# Patient Record
Sex: Female | Born: 1955 | Race: White | Hispanic: No | Marital: Married
Health system: Midwestern US, Academic
[De-identification: ages and names within clinical notes are randomized; demographics above are authoritative.]

---

## 2021-01-15 NOTE — Unmapped (Signed)
Encounter created in Care Everywhere.

## 2021-01-29 ENCOUNTER — Ambulatory Visit: Admit: 2021-01-29 | Payer: PRIVATE HEALTH INSURANCE

## 2021-01-29 ENCOUNTER — Ambulatory Visit: Admit: 2021-01-29 | Payer: PRIVATE HEALTH INSURANCE | Attending: Hematology

## 2021-01-29 DIAGNOSIS — C9110 Chronic lymphocytic leukemia of B-cell type not having achieved remission: Secondary | ICD-10-CM

## 2021-01-29 LAB — MAGNESIUM: Magnesium: 2.2 mg/dL (ref 1.5–2.5)

## 2021-01-29 LAB — DIFFERENTIAL
Basophils Absolute: 0 /uL (ref 0–200)
Basophils Relative: 0 % (ref 0.0–1.0)
Eosinophils Absolute: 436 /uL (ref 15–500)
Eosinophils Relative: 1 % (ref 0.0–8.0)
Lymphocytes Absolute: 36624 /uL (ref 850–3900)
Lymphocytes Relative: 84 % (ref 15.0–45.0)
Monocytes Absolute: 436 /uL (ref 200–950)
Monocytes Relative: 1 % (ref 0.0–12.0)
Neutrophils Absolute: 2616 /uL (ref 1500–7800)
Neutrophils Relative: 6 % (ref 40.0–80.0)
Other Cells Relative: 8 % (ref 0.0–0.0)
PLT Morphology: NORMAL

## 2021-01-29 LAB — COMPREHENSIVE METABOLIC PANEL
ALT: 13 U/L (ref 7–52)
AST: 21 U/L (ref 13–39)
Albumin: 3.7 g/dL (ref 3.5–5.7)
Alkaline Phosphatase: 68 U/L (ref 36–125)
Anion Gap: 8 mmol/L (ref 3–16)
BUN: 17 mg/dL (ref 7–25)
CO2: 29 mmol/L (ref 21–33)
Calcium: 8.6 mg/dL (ref 8.6–10.3)
Chloride: 105 mmol/L (ref 98–110)
Creatinine: 0.82 mg/dL (ref 0.60–1.30)
EGFR: 78
Glucose: 82 mg/dL (ref 70–100)
Osmolality, Calculated: 295 mosm/kg (ref 278–305)
Potassium: 4 mmol/L (ref 3.5–5.3)
Sodium: 142 mmol/L (ref 133–146)
Total Bilirubin: 0.6 mg/dL (ref 0.0–1.5)
Total Protein: 5.6 g/dL — ABNORMAL LOW (ref 6.4–8.9)

## 2021-01-29 LAB — CBC
Hematocrit: 33.4 % (ref 35.0–45.0)
Hemoglobin: 10.8 g/dL (ref 11.7–15.5)
MCH: 30.6 pg (ref 27.0–33.0)
MCHC: 32.5 g/dL (ref 32.0–36.0)
MCV: 94.1 fL (ref 80.0–100.0)
MPV: 7.4 fL (ref 7.5–11.5)
Platelets: 255 10*3/uL (ref 140–400)
RBC: 3.54 10*6/uL (ref 3.80–5.10)
RDW: 21.5 % (ref 11.0–15.0)
WBC: 43.6 10*3/uL (ref 3.8–10.8)

## 2021-01-29 LAB — CLL FISH PANEL

## 2021-01-29 LAB — LACTATE DEHYDROGENASE: LD: 647 U/L (ref 110–270)

## 2021-01-29 LAB — CYTOMEGALOVIRUS DNA, QUANT, RT PCR: CMV DNA Qnt: NOT DETECTED IU/mL

## 2021-01-29 LAB — RETICULOCYTE COUNT, AUTO
Immature Retic Fract: 0.59 (ref 0.09–0.56)
Retic Ct Abs: 79118 /uL (ref 20000–80000)
Retic Ct Pct: 2.21 % (ref 0.50–2.00)

## 2021-01-29 LAB — URIC ACID: Uric Acid: 5.5 mg/dL (ref 3.8–8.7)

## 2021-01-29 LAB — EPSTEIN BARR VIRUS DNA, QNT PCR, BLOOD: EBV DNA, Quantitative PCR: NOT DETECTED IU/mL

## 2021-01-29 LAB — OSU MISCELLANEOUS REFERENCE TEST

## 2021-01-29 LAB — PHOSPHORUS: Phosphorus: 3.9 mg/dL (ref 2.1–4.5)

## 2021-01-29 LAB — DIRECT ANTIGLOBULIN TEST: DAT: NEGATIVE

## 2021-01-29 LAB — HAPTOGLOBIN: Haptoglobin: 130 mg/dL (ref 44–215)

## 2021-01-29 NOTE — Unmapped (Addendum)
-   PET scan at [facility]  - Continue to practice Covid-19 precautions and call us immediately if you have any Covid-19 exposures    Contact the Hematology/ Oncology Office at [phone]  (Option 3 to speak with a nurse)  If it is after hours Between 5 PM and 8 AM (overnight), weekends and holidays:  You will be forwarded to the [facility] Operator.  Ask for the Hematology/Oncology Dr. on call to address your issues.     When to contact your doctor or health care provider:  Contact your health care provider immediately, day or night, if you should experience any of the following symptoms, or go to the ER if after normal clinic hours (8am-5pm):  Fever of 100.4?? F (38?? C) or higher, chills (possible signs of infection)  Sudden weakness of arms or legs, or difficulty speaking  Sudden onset of shortness of breath or chest pain   New onset of seizures  Severe allergic reaction with swelling of mouth or throat, or trouble breathing  Sudden changes in alertness or loss of consciousness  The following symptoms require medical attention, but are not an emergency.  Contact your health care provider within 24 hours of noticing any of the following:  Nausea (interferes with ability to eat and unrelieved with prescribed medication).   Vomiting (vomiting more than 2-3 times in a 24 hour period, not relieved by nausea medication).   Diarrhea (more than 3 episodes in a 24-hour period, not relieved by anti-diarrheal).   Unusual bleeding or bruising   Black or tarry stools, or blood in your stools.   Blood in the urine.   Extreme fatigue (unable to carry on self-care activities).   Constipation unrelieved by laxative use.   Swelling of the feet or ankles.  Sudden weight gain.   Signs of infection such as redness or swelling, pain on swallowing, coughing up mucous, or painful urination.   Unable to eat or drink for 24 hours or have signs of dehydration: tiredness, thirst, dry mouth, dark and decrease amount of urine, or  dizziness.      Contact Numbers:    Clinic Phone & Appointment Changes: [phone] (option 1 for scheduling, option 3 to speak with a nurse)  Clinic Fax: [phone]    For Medication Refills:     Please allow atleast 2 business days for all medication refills, including narcotics. Please reach out to the filling pharmacy to request a refill for all non-narcotic medications. The filling pharmacy should also be who to contact to verify when to pick up the medication.     All Paperwork:    Please allow up to 7 business days from the time of request for all disability, FMLA, etc to be filled out. Please specify what your request is as to what and where we should send completed paperwork. (This is to inform us if we should send the completed papers to you or directly to your employer).

## 2021-01-29 NOTE — Unmapped (Signed)
I participated in obtaining the history, past medical, past surgical, family history, social history, review of systems, physical exam, lab review, and development of diagnosis, treatment plan, and follow-up for the patient in conjunction with the fellow rotating with [place] in clinic.  The plan outlined in the note by the fellow is one that I have reviewed and developed together with them. Karen Lamb has findings concerning for progression on CC99282 + Obinutuzumab with increasing nodes (cervical and axillary region), lymphocytosis, and right inguinal node which she has noted disproportionate nodal enlargement over the past 2-3 weeks which is hard to palpation as well.  She has noted intermittent pre-night sweats and warmness. Fatigue has also increased.  Review of recent labs and those at [facility] show rising lymphocyte count and LDH 2+ ULN.  She has not had genomic studies since going on CC99282.  We will send off a [facility] panel and PLCG2/BTK panel ([facility]). Assessment of FISH also sent.  She is going to [facility] and needs a PET scan to determine SUV of inguinal area to see if high SUV present.  If this is present, I would have concern for either Richter's syndrome or Hodgkin's disease and directed biopsy would be needed.  Her disease has had some proliferative component in the past so I would hope this is not Richter's/  EBV infection could also cause this presentation and we will rule this out with EBV PCR.  Relative to therapy, for Richter's we would favor cytoreduction (non chemotherapy) followed by CAR-T cell would be the direction I would go.  If Hodgkin's we would treat with curative ABVD.  Finally, if CLL, determining the current genomic status of her tumor, in particular if PLCG2 mutation has disappeared would consider a 3rd or 4th generation BTK inhibitor (LP-168).  If PLCG2 mutation is present, I would agree with the PKC-B targeting agent together with venetoclax.   Standard off therapy treatment I would  consider venetoclax +/- rituximab.  For BTKi resistant disease, the benefit of adding CD20 antibody to venetoclax is uncertain.  I would not do the BTK degrader study as they have reported a 20% frequency of atrial fibrillation thus far and also will be going to an intermittent schedule which could result in flare of disease.  It is unclear how the FDA will also view the BTK degrader and it will not be surprising during the time of evaluation of her that they could even be put on clinical hold.  I talked with Ms. Samuel Bouche at [facility] about our evaluation.  We are happy to remain involved in her care.

## 2021-01-29 NOTE — Unmapped (Signed)
[facility]  Outpatient Hematology Clinic Note    Patient Name: Karen Lamb  MRN: [id number]    Follow up Visit For: Follow up, night sweats, enlarging Rt inguinal lymph nodes over the last 6-7 weeks    History of Present Illness      Karen Lamb is a 65 y.o. 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  She has a relapsed/refractory disease s/p multiple lines of therapy. Most recently on clinical trial [facility]71 (obinutuzumab & [id number]). Clinical trial drug dose reduced due to significant side effects.  Patient is well known to Dr. Colette Ribas. She used to follow with him at [facility].    Today, she presents to discuss next treatment options and potential clinical trials.  Karen Lamb reported today new onset night sweats, frequent over the last 2 weeks, bed and cloths get soaked with sweats. Also reported recent low grade fever, that responded to tylenol. Did not take any antibiotics, and denied any other symptoms to suggest infectious etiology (cough, SOB, dysuria, skin lesions).  In Jul/2022, she was noted to have Rt inguinal lymph node, was tender at the time, since then, it has more than doubled in size, became non tender, and visible. She does not feel any other lymph nodes herself.  CT scan of the chest on Jun/29/22 showed worsening axillary lymph adenopathy bilaterally. Lymphocytosis worsening over the last 2 months. apparently her disease became unresponsive to the current treatment regimen.  She is scheduled to see Dr. Tamala Julian and Samuel Bouche, NP at [facility] tomorrow also to discuss clinical trials options.      Oncologic History  Dx: CLL (IgVH unmutated, t(14;19),  Prognostic Score:  ECOG Performance Score: 0  Cytogenetics/Molecular:  BTK C481S and multiple PLCG2 mutations are detected again in this B-cell sorted specimen, along with the NOTCH1 PEST domain mutation and SETD2 and SH2B3 variants, consistent with persistent  CLL.  Treatment History:  2011 - Flavopiridol, Cytoxan, Rituximab in 2011  2014-2017  acalabrutinib, developed drug resistance  01/01/16 -  started ibrutinib on 01/01/2016, added VAY736 in 2018, and then progressed.   12/2017 -04/2019 duvelisib (ibrutinib ended on 12/14/17)  2021 - trial with LOXO-305 (progressed) and ARQ-531 (progressed)     12/07/19-01/29/21, she is on [facility] 20071 (obinutuzumab & [id number]). She started obinutuzumab 12/07/19 (given in hospital for TLS risk) . Then started [id number] 0.2 mg daily x 7 days with 7 days off - first week ended 12/27/19.Treatment has been complicated by neutropenia treated with intermittent G-CSF. [id number] dose was reduced 50% in cycle 3.  She initially had a good response to obinutuzuamb+CC99282 however has had increasing lymphocytosis since February 2022. Increased adenopathy noted on CTs in June 2022. (disease progression, being evaluated for Richter's)      History     Past Medical History  Past Medical History:   Diagnosis Date   ??? History of blood transfusion      Past Surgical History  History reviewed. No pertinent surgical history.  Family History  History reviewed. No pertinent family history.  Social History  Social History     Tobacco Use   ??? Smoking status: Never Smoker   ??? Smokeless tobacco: Never Used   Substance Use Topics   ??? Alcohol use: Yes     Comment: 3 or 4 week   ??? Drug use: Never     Allergies  No Known Allergies  Medications  Current Outpatient Medications   Medication  Sig Dispense Refill   ??? acetaminophen (TYLENOL) 325 MG tablet Take 650 mg by mouth as needed.     ??? calcium-magnesium-zinc 333-133-5 mg Tab Take 1 tablet by mouth 3 times daily as needed.     ??? cholecalciferol, vitamin D3, 50 mcg (2,000 unit) Cap Take 2,000 Units by mouth daily.     ??? conjugated estrogens (PREMARIN) vaginal cream 0.5 g by Vaginal route every Wednesday.     ??? fluticasone propionate (FLONASE) 50 mcg/actuation nasal spray 2 sprays by Nasal route daily. QAM     ??? latanoprost  (XALATAN) 0.005 % ophthalmic solution 1 drop into affected eye in the evening     ??? lifitegrast 5 % Dpet Apply to eye 2 times daily.     ??? PREVIDENT 5000 BOOSTER PLUS 1.1 % Pste      ??? tretinoin (RETIN-A) 0.025 % cream APPLY a pea size amount to entire face EVERY DAY AT BEDTIME as able     ??? valACYclovir (VALTREX) 1000 MG tablet Take 1 tablet by mouth daily.       No current facility-administered medications for this visit.       ROS, Vitals and Exam      ROS:   Review of Systems   Constitutional: Positive for chills, fever, malaise/fatigue and weight loss.        Night sweats   HENT: Negative for congestion, ear discharge, hearing loss and sore throat.    Eyes: Negative for double vision, pain and redness.   Respiratory: Negative for cough, sputum production, shortness of breath and wheezing.    Cardiovascular: Negative for chest pain, palpitations, orthopnea and leg swelling.   Gastrointestinal: Negative for abdominal pain, constipation, diarrhea, heartburn, nausea and vomiting.   Genitourinary: Negative for dysuria and hematuria.   Musculoskeletal: Negative for joint pain and myalgias.   Skin: Negative for itching and rash.   Neurological: Negative for dizziness, tingling, speech change, focal weakness and weakness.   Endo/Heme/Allergies: Does not bruise/bleed easily.        Palpable Rt inguinal mass   Psychiatric/Behavioral: Negative for depression. The patient is not nervous/anxious.          Vitals:    01/29/21 1401   BP: 118/67   Pulse: 83   Resp: 16   Temp: 97.5 ??F (36.4 ??C)   SpO2: 95%       Physical Examination  Constitutional: She is oriented to person, place, and time. She appears well-developed and well-nourished. No distress.   HENT: Head: Normocephalic and atraumatic.   Eyes: EOM are normal.   Neck: Normal range of motion. Neck supple.   Cardiovascular: Normal rate, regular rhythm and normal heart sounds.    Pulmonary/Chest: Effort normal and breath sounds normal. No respiratory distress. No wheezes.  She has no rales. She exhibits no tenderness.   Abdominal: Soft. She exhibits no distension. There is no tenderness. Spleen tip palpable, 4 cm below costal margin   Musculoskeletal: Normal range of motion. She exhibits no edema and no tenderness.   Neurological: She is alert and oriented to person, place, and time. She exhibits normal muscle tone. Coordination normal.   Skin: Skin is warm and dry. No rash noted. She is not diaphoretic. No erythema.   Psychiatric: She has a normal mood and affect. Her behavior is normal. Judgment and thought content normal.  Lymph Node examination:   Location Right cm Left cm   Cervical 2x1 2x2   Supraclavicular 1x1 1x1   Axillary 1x1 2x2  Inguinal 3x3 firm texture 1x1   Femoral neg neg        Recent imaging, pathology and labs     Laboratories  Appointment on 01/29/2021   Component Date Value   ??? DAT 01/29/2021 Negative    ??? Retic Ct Pct 01/29/2021 2.21 (A)   ??? Retic Ct Abs 01/29/2021 79,118    ??? Immature Retic Fract 01/29/2021 0.59 (A)   ??? Haptoglobin 01/29/2021 130    ??? Magnesium 01/29/2021 2.2    ??? Phosphorus 01/29/2021 3.9    Appointment on 01/29/2021   Component Date Value   ??? WBC 01/29/2021 43.6 (A)   ??? RBC 01/29/2021 3.54 (A)   ??? Hemoglobin 01/29/2021 10.8 (A)   ??? Hematocrit 01/29/2021 33.4 (A)   ??? MCV 01/29/2021 94.1    ??? Karen Lamb 01/29/2021 30.6    ??? MCHC 01/29/2021 32.5    ??? RDW 01/29/2021 21.5 (A)   ??? Platelets 01/29/2021 255    ??? MPV 01/29/2021 7.4 (A)   ??? Neutrophils Relative 01/29/2021 6.0 (A)   ??? Lymphocytes Relative 01/29/2021 84.0 (A)   ??? Monocytes Relative 01/29/2021 1.0    ??? Eosinophils Relative 01/29/2021 1.0    ??? Basophils Relative 01/29/2021 0.0    ??? Other Cells Relative 01/29/2021 8.0 (A)   ??? Neutrophils Absolute 01/29/2021 2,616    ??? Lymphocytes Absolute 01/29/2021 36,624 (A)   ??? Monocytes Absolute 01/29/2021 436    ??? Eosinophils Absolute 01/29/2021 436    ??? Basophils Absolute 01/29/2021 0    ??? PLT Morphology 01/29/2021 Platelet morphology appears normal    ???  Sodium 01/29/2021 142    ??? Potassium 01/29/2021 4.0    ??? Chloride 01/29/2021 105    ??? CO2 01/29/2021 29    ??? Anion Gap 01/29/2021 8    ??? BUN 01/29/2021 17    ??? Creatinine 01/29/2021 0.82    ??? Glucose 01/29/2021 82    ??? Calcium 01/29/2021 8.6    ??? Total Bilirubin 01/29/2021 0.6    ??? AST 01/29/2021 21    ??? ALT 01/29/2021 13    ??? Alkaline Phosphatase 01/29/2021 68    ??? Total Protein 01/29/2021 5.6 (A)   ??? Albumin 01/29/2021 3.7    ??? Osmolality, Calculated 01/29/2021 295    ??? EGFR 01/29/2021 78    ??? LD 01/29/2021 647 (A)   ??? Uric Acid 01/29/2021 5.5         01/03/21 12/05/20 11/07/20 10/11/20 09/12/20 08/16/20   LDH Total 379 412??High?? 306??High?? 299??High?? 311??High?? 306??High??   LACTATE DEHYDROGENASE -- -- -- -- -- --      Ref Range & Units 3 wk ago   IGG 600 - 1560 mg/dL 161??Low??    IgA 90 - 410 mg/dL 20??Low??    IGM 30 - 360 mg/dL 49          NGS 0/96/0454  SEQUENCE ALTERATIONS DETECTED   _________________________________________________________________________   Gene: ??BTK   Alteration: ??p.C481S   Exon: ??14   Nucleotide: ??c.1441T>A   Type: ??Substitution - Missense   VAF: ??0.9%   Class: ??Pathogenic   Genomic Source Class: ??Somatic   COSMIC: ??[id number], [id number]   dbSNP (ExAC MAF, Count): ??[id number] (0.0, 0)   Notes: ?? Tier 1, therapy resistance (CLL)       Gene: ??NOTCH1   Alteration: ??p.P2543fs   Exon: ??34   Nucleotide: ??L.2440_1027OZDGU   Type: ??Deletion - Frameshift   VAF: ??81.6%   Class: ??Pathogenic   Genomic Source Class: ??Somatic  COSMIC: ??[id number], [id number]   dbSNP (ExAC MAF, Count): ??[id number] (0.00, U923051)   Notes: ?? Tier 1 (diagnosis, CLL)       Gene: ??PLCG2   Alteration: ??p.S707F   Exon: ??19   Nucleotide: ??c.2120C>T   Type: ??Substitution - Missense   VAF: ??26.3%   Class: ??Pathogenic   Genomic Source Class: ??Somatic   COSMIC: ??[id number], [id number]   dbSNP (ExAC MAF, Count): ??NA (0.0, 0)   Notes: ??Tier 1, therapy resistance (CLL)     Gene: ??PLCG2   Alteration: ??p.M1141K   Exon: ??29    Nucleotide: ??c.3422T>A   Type: ??Substitution - Missense   VAF: ??2.2%   Class: ??Pathogenic   Genomic Source Class: ??Somatic   COSMIC: ??[id number], [id number]   dbSNP (ExAC MAF, Count): ??NA (0.0, 0)   Notes: ??Tier 2, therapy resistance (CLL)     Gene: ??PLCG2   Alteration: ??p.D1140E   Exon: ??29   Nucleotide: ??c.3420T>G   Type: ??Substitution - Missense   VAF: ??0.5%   Class: ??Likely Pathogenic   Genomic Source Class: ??Somatic   COSMIC: ??   dbSNP (ExAC MAF, Count): ??NA (0.0, 0)   Notes: ??Tier 2, therapy resistance (CLL)     Gene: ??PLCG2   Alteration: ??p.D1140E   Exon: ??29   Nucleotide: ??c.3420T>A   Type: ??Substitution - Missense   VAF: ??0.7%   Class: ??Likely Pathogenic   Genomic Source Class: ??Somatic   COSMIC: ??   dbSNP (ExAC MAF, Count): ??NA (0.0, 0)   Notes: ?? Tier 2 therapy resistance in B-NHL/CLL       Gene: ??PLCG2   Alteration: ??p.D1144G   Exon: ??29   Nucleotide: ??c.3431A>G   Type: ??Substitution - Missense   VAF: ??15.9%   Class: ??Likely Pathogenic   Genomic Source Class: ??Somatic   COSMIC: ??NA   dbSNP (ExAC MAF, Count): ??NA (0.0, 0)   Notes: ??Tier 2, therapy resistance (CLL)     Gene: ??SETD2   Alteration: ??p.I1442fs   Exon: ??4   Nucleotide: ??c.4480dupA   Type: ??Insertion - Frameshift   VAF: ??45.9%   Class: ??Likely Pathogenic   Genomic Source Class: ??Somatic   COSMIC: ??NA   dbSNP (ExAC MAF, Count): ??NA (0.0, 0)     Gene: ??SH2B3   Alteration: ??p.R140C   Exon: ??1   Nucleotide: ??c.418C>T   Type: ??Substitution - Missense   VAF: ??25.7%   Class: ??Uncertain Significance   Genomic Source Class: ??Somatic   COSMIC: ??NA   dbSNP (ExAC MAF, Count): ??[id number] (0.00, 5/17292)     Radiology:  12/05/2020 6:06 PM EDT??   IMPRESSION:    1. ??Worsening bilateral axillary lymphadenopathy. No lymphadenopathy elsewhere  within the chest.    2. ??Stable size of right middle lobe nodule. No new or enlarging nodules.    I personally viewed and interpreted these images and I have reviewed and  approved this report.    Electronically Signed  By: Karen Bannister, MD on 12/05/2020 6:06 PM??           Assessment and Plan    Madisynn Plair is a 65 y.o. 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 s/p multiple lines of therapy. Most recently on clinical trial [facility] (obinutuzumab & [id number]). Clinical trial drug dose reduced due to significant side effects.    Now with evidence disease progression    1. relapsed/refractory CLL:  - with palpable adenopathy in the neck, axilla and most notably Rt inguinal  lymphadenopathy. Spleen tip is palpable on exam today, 4 cm below costal margin.  -  she does have B symptoms. Night sweats, fever and unintentional weight loss.   - she does have rapidly enlarging Rt inguinal lymph adenopathy. More than doubling in size over 6-7 weeks.  - LDH previously raging 300-400, however, up to 647 during today's visit.  - Discussed potential complications of CLL, particularly risk of Richter's transformation.   - We went over her last NGS panel done in Jun/2021 that showed multiple PLCG2 mutations.    Plan:    - Her presentation today is concerning for richter's transformation, we will start work up with PET-CT scan (likely to be done at [facility]), and if positive, proceed with lymph node excisional biopsy.  - If richter's transformation confirmed, will direct therapy toward that depending on final histology.  - If richter's ruled out, will go over her NGS molecular testing (TEMPUS panel ordered) and if her PLCG2 mutations resolved/clone disappeared, would consider [phone] clinical trial with PKC-beta inhibitor if patient agreeable.  - Patient inquired about the [id number] study, we advised against this trial given recent safety concerns particularly with 20% atrial fibrillation risk. -off note, patient did develop A-fib previously, but it has resolved-.  - Other differential diagnosis is acute viral infection flare, such as EBV infection.  - Will draw the  following labs today: CBC w/diff, CMP, LDH, uric acid, hemolysis labs, CMV PCR, EBV PCR, CLL FISH panel, BTK-PLCG2 test and TEMPUS panel,      Dr. Colette Ribas has contacted the team at [facility] and updated them about this recommendations.       2. ID: Reviewed that CLL affects the immune system, and infections may be more frequent in CLL patients than those unaffected.  - Up to date on her vaccines.    Per records:  - Had fourth vaccination and 2nd booster on 10/30/2020. Would be due for #5 01/30/2021.   - Got Evusheld 06/21/2020 with make-up dose 09/12/2020. Due for second set of Evusheld in October.    3. Health maintenance.    -  Up to date on her recommended cancer screening tests. With mammogram, colonoscopy and skin exams.    Discussed with Dr. Alden Hipp, MD  Hematology and Medical Oncology fellow  Pager: [phone]

## 2021-01-29 NOTE — Unmapped (Signed)
Peripheral blood specimen mailed to [facility] via [employer] as requested. Tracking# [id number].      Requisition form, patient assistance form, and clinicals uploaded into the patient portal.
